# Patient Record
Sex: Female | Born: 1935
Health system: Southern US, Community
[De-identification: ages and names within clinical notes are randomized; demographics above are authoritative.]

## PROBLEM LIST (undated history)

## (undated) DIAGNOSIS — E079 Disorder of thyroid, unspecified: Secondary | ICD-10-CM

## (undated) DIAGNOSIS — I10 Essential (primary) hypertension: Secondary | ICD-10-CM

---

## 2007-05-04 ENCOUNTER — Encounter: Payer: Self-pay | Admitting: Family Medicine

## 2007-08-18 LAB — CONVERTED CEMR LAB: Pap Smear: NORMAL

## 2007-09-22 ENCOUNTER — Encounter: Admission: RE | Admit: 2007-09-22 | Discharge: 2007-09-22 | Payer: Self-pay | Admitting: Interventional Radiology

## 2007-11-15 ENCOUNTER — Encounter: Payer: Self-pay | Admitting: Family Medicine

## 2008-08-08 ENCOUNTER — Encounter: Payer: Self-pay | Admitting: Family Medicine

## 2008-08-08 LAB — CONVERTED CEMR LAB
ALT: 20 units/L
AST: 28 units/L
Albumin: 4.2 g/dL
Alkaline Phosphatase: 66 units/L
BUN: 7 mg/dL
CO2: 32 meq/L
Calcium: 9.7 mg/dL
Chloride: 96 meq/L
Cholesterol: 223 mg/dL
Creatinine, Ser: 0.6 mg/dL
Glucose, Bld: 100 mg/dL
HDL: 70 mg/dL
LDL Cholesterol: 129 mg/dL
Potassium: 4.5 meq/L
Sodium: 137 meq/L
TSH: 0.27 microintl units/mL
Total Bilirubin: 0.8 mg/dL
Total Protein: 6.8 g/dL
Triglycerides: 119 mg/dL

## 2008-10-31 ENCOUNTER — Encounter: Admission: RE | Admit: 2008-10-31 | Discharge: 2008-10-31 | Payer: Self-pay | Admitting: Family Medicine

## 2008-10-31 ENCOUNTER — Ambulatory Visit: Payer: Self-pay | Admitting: Family Medicine

## 2008-10-31 DIAGNOSIS — E039 Hypothyroidism, unspecified: Secondary | ICD-10-CM | POA: Insufficient documentation

## 2008-10-31 DIAGNOSIS — I10 Essential (primary) hypertension: Secondary | ICD-10-CM | POA: Insufficient documentation

## 2008-10-31 DIAGNOSIS — M542 Cervicalgia: Secondary | ICD-10-CM | POA: Insufficient documentation

## 2008-11-01 LAB — CONVERTED CEMR LAB: TSH: 2.742 microintl units/mL (ref 0.350–4.50)

## 2008-11-05 ENCOUNTER — Encounter: Payer: Self-pay | Admitting: Family Medicine

## 2009-01-09 ENCOUNTER — Ambulatory Visit: Payer: Self-pay | Admitting: Family Medicine

## 2009-01-09 DIAGNOSIS — M5090 Cervical disc disorder, unspecified, unspecified cervical region: Secondary | ICD-10-CM | POA: Insufficient documentation

## 2009-01-09 DIAGNOSIS — Z780 Asymptomatic menopausal state: Secondary | ICD-10-CM | POA: Insufficient documentation

## 2009-05-01 ENCOUNTER — Encounter: Payer: Self-pay | Admitting: Family Medicine

## 2009-06-26 ENCOUNTER — Encounter: Payer: Self-pay | Admitting: Family Medicine

## 2009-07-30 ENCOUNTER — Encounter: Payer: Self-pay | Admitting: Family Medicine

## 2009-08-22 ENCOUNTER — Encounter: Payer: Self-pay | Admitting: Family Medicine

## 2009-09-02 ENCOUNTER — Ambulatory Visit: Payer: Self-pay | Admitting: Family Medicine

## 2009-09-02 DIAGNOSIS — R1012 Left upper quadrant pain: Secondary | ICD-10-CM | POA: Insufficient documentation

## 2009-09-03 LAB — CONVERTED CEMR LAB
ALT: 16 units/L (ref 0–35)
AST: 21 units/L (ref 0–37)
Albumin: 4.5 g/dL (ref 3.5–5.2)
Alkaline Phosphatase: 61 units/L (ref 39–117)
BUN: 11 mg/dL (ref 6–23)
Basophils Absolute: 0 10*3/uL (ref 0.0–0.1)
Basophils Relative: 1 % (ref 0–1)
CO2: 29 meq/L (ref 19–32)
Calcium: 9.6 mg/dL (ref 8.4–10.5)
Chloride: 96 meq/L (ref 96–112)
Creatinine, Ser: 0.65 mg/dL (ref 0.40–1.20)
Eosinophils Absolute: 0.1 10*3/uL (ref 0.0–0.7)
Eosinophils Relative: 1 % (ref 0–5)
Glucose, Bld: 80 mg/dL (ref 70–99)
HCT: 40.8 % (ref 36.0–46.0)
Hemoglobin: 13.3 g/dL (ref 12.0–15.0)
Iron: 68 ug/dL (ref 42–145)
Lymphocytes Relative: 38 % (ref 12–46)
Lymphs Abs: 2.3 10*3/uL (ref 0.7–4.0)
MCHC: 32.6 g/dL (ref 30.0–36.0)
MCV: 84.8 fL (ref 78.0–100.0)
Monocytes Absolute: 0.4 10*3/uL (ref 0.1–1.0)
Monocytes Relative: 7 % (ref 3–12)
Neutro Abs: 3.2 10*3/uL (ref 1.7–7.7)
Neutrophils Relative %: 53 % (ref 43–77)
Platelets: 325 10*3/uL (ref 150–400)
Potassium: 3.9 meq/L (ref 3.5–5.3)
RBC: 4.81 M/uL (ref 3.87–5.11)
RDW: 14.7 % (ref 11.5–15.5)
Saturation Ratios: 18 % — ABNORMAL LOW (ref 20–55)
Sodium: 135 meq/L (ref 135–145)
TIBC: 385 ug/dL (ref 250–470)
TSH: 0.542 microintl units/mL (ref 0.350–4.500)
Total Bilirubin: 0.3 mg/dL (ref 0.3–1.2)
Total Protein: 7.2 g/dL (ref 6.0–8.3)
UIBC: 317 ug/dL
Vit D, 25-Hydroxy: 25 ng/mL — ABNORMAL LOW (ref 30–89)
Vitamin B-12: 1309 pg/mL — ABNORMAL HIGH (ref 211–911)
WBC: 6 10*3/uL (ref 4.0–10.5)

## 2009-09-10 DIAGNOSIS — E785 Hyperlipidemia, unspecified: Secondary | ICD-10-CM | POA: Insufficient documentation

## 2009-09-10 DIAGNOSIS — I209 Angina pectoris, unspecified: Secondary | ICD-10-CM | POA: Insufficient documentation

## 2009-09-17 ENCOUNTER — Encounter: Admission: RE | Admit: 2009-09-17 | Discharge: 2009-09-17 | Payer: Self-pay | Admitting: Family Medicine

## 2009-09-17 ENCOUNTER — Encounter: Payer: Self-pay | Admitting: Family Medicine

## 2009-09-18 DIAGNOSIS — M899 Disorder of bone, unspecified: Secondary | ICD-10-CM | POA: Insufficient documentation

## 2009-09-18 DIAGNOSIS — M949 Disorder of cartilage, unspecified: Secondary | ICD-10-CM

## 2009-10-29 IMAGING — CR DG CERVICAL SPINE 2 OR 3 VIEWS
4 series · 4 of 4 positions shown · non-contrast
Comparison: None

CLINICAL DATA: Neck pain.

CERVICAL SPINE - 2-3 VIEW

[view not recorded (1 of 4)]
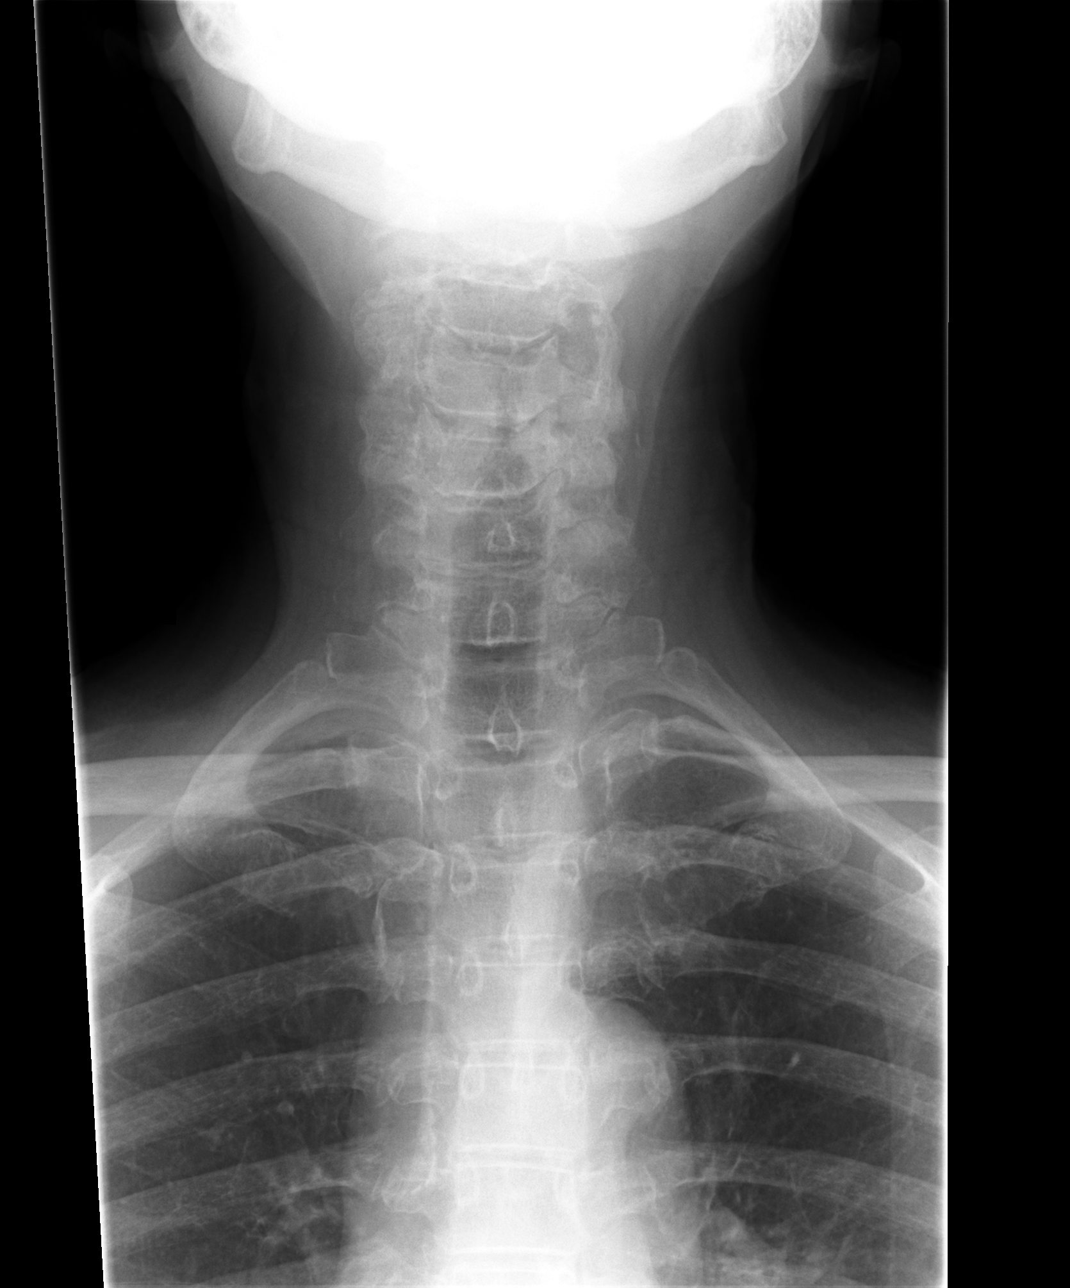

[view not recorded (2 of 4)]
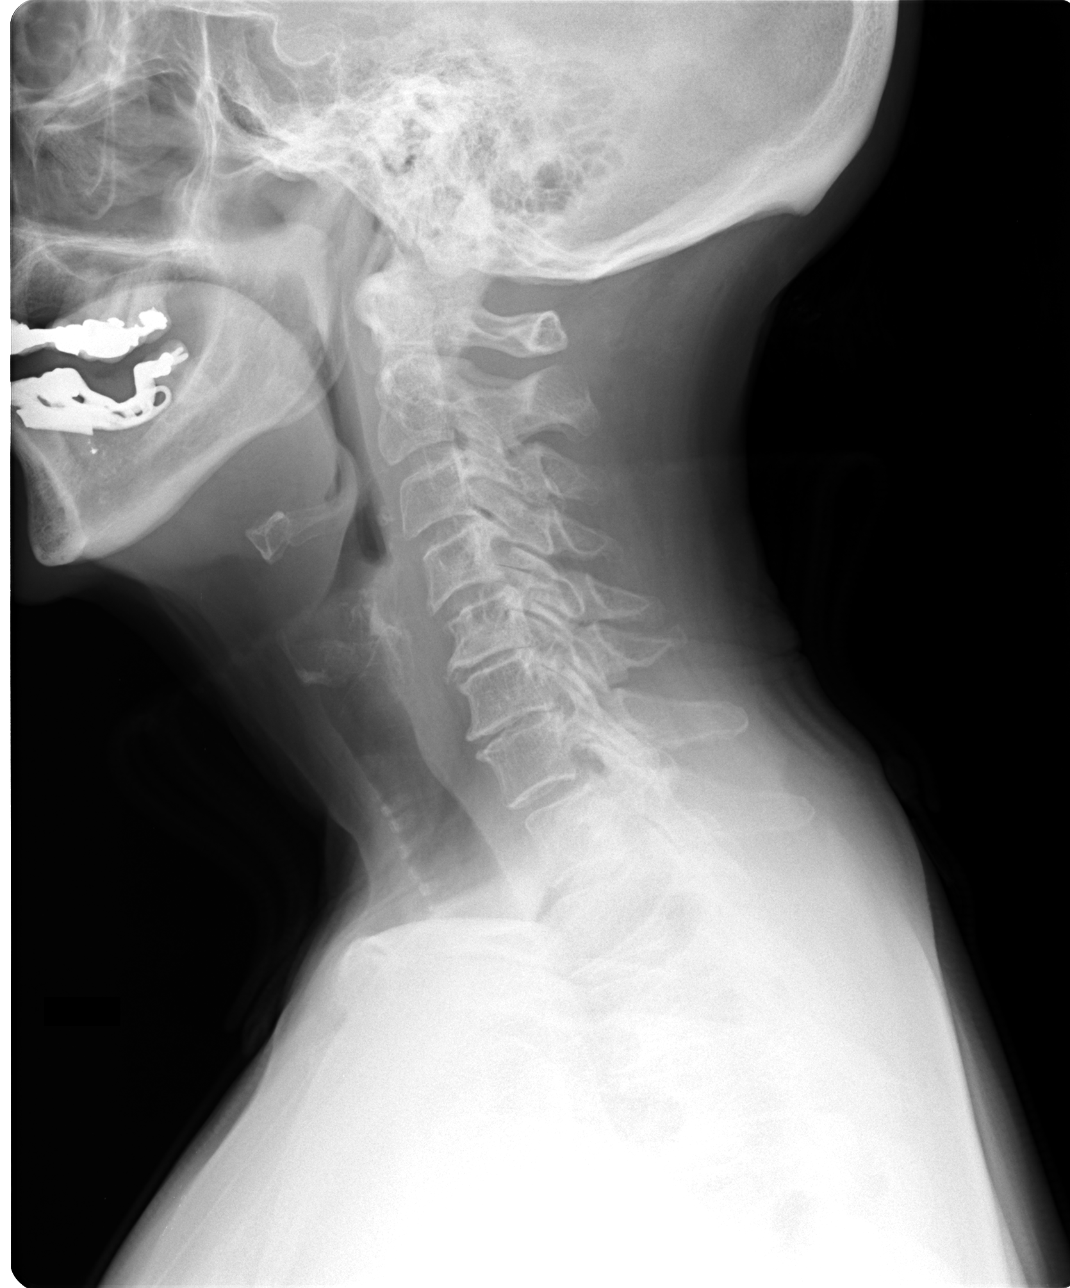

[view not recorded (3 of 4)]
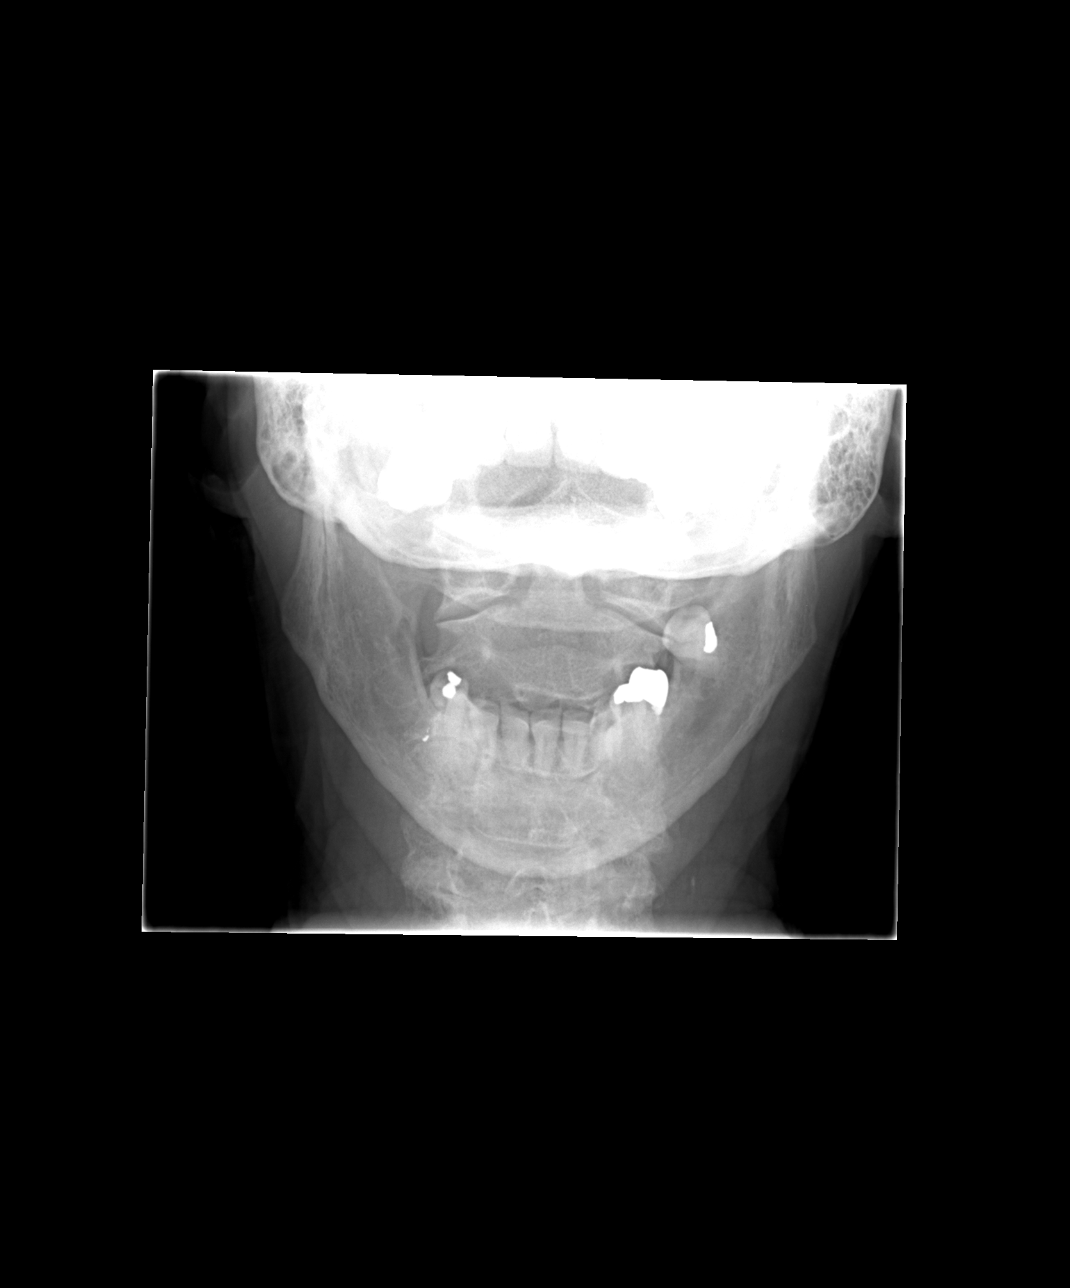

[view not recorded (4 of 4)]
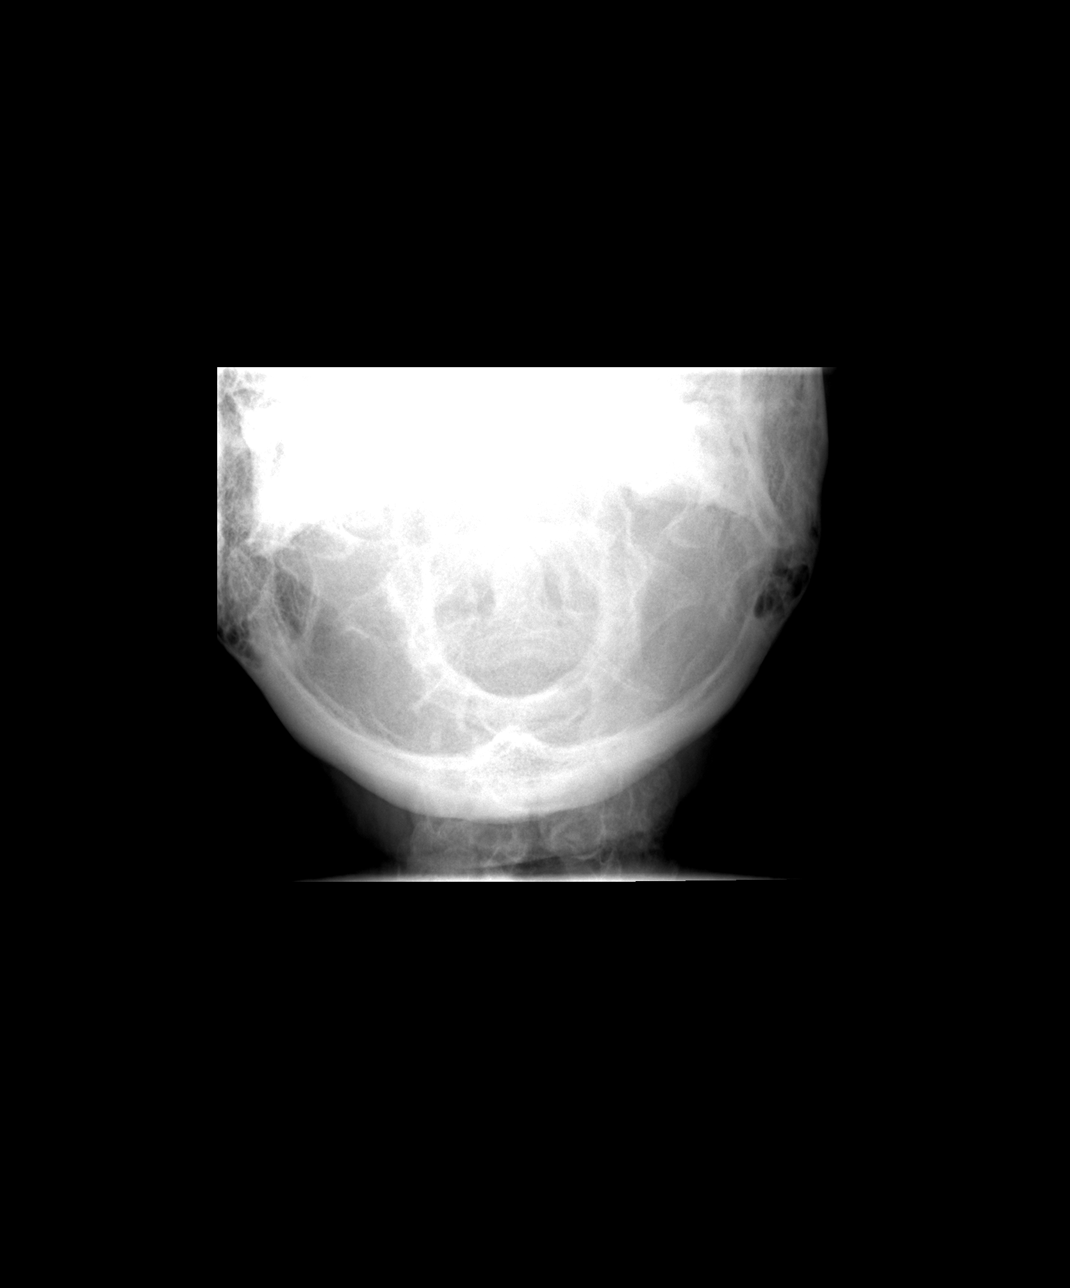

[4 of 4 positions shown; findings below may reference images not displayed]

FINDINGS: Cervical spine is visualized from the occiput to T1.
Prevertebral soft tissues are within normal limits.  There is mild
straightening of the normal cervical lordosis.  Multilevel
uncovertebral hypertrophy and endplate degenerative changes are
seen.  Loss of disc space height is worst at C5-6 and C6-7.  Dens
is partially obscured on the dedicated view.  Visualized lung
apices are clear.
IMPRESSION: Straightening of the normal cervical lordosis with spondylosis,
worst at C5-6 and C6-7.

## 2010-02-24 ENCOUNTER — Encounter: Payer: Self-pay | Admitting: Family Medicine

## 2010-09-15 IMAGING — OT DG DXA BONE DENSITY STUDY HL7
2 series · 2 of 2 positions shown · non-contrast
Comparison: None.

CLINICAL DATA: 79-year-old post menopausal Caucasian female with
history of low vitamin D levels.  Currently on calcium and vitamin
D supplementation.

[Series 1: — · left · 1 of 1 slices shown (1 of 2)]
[im 1/1]
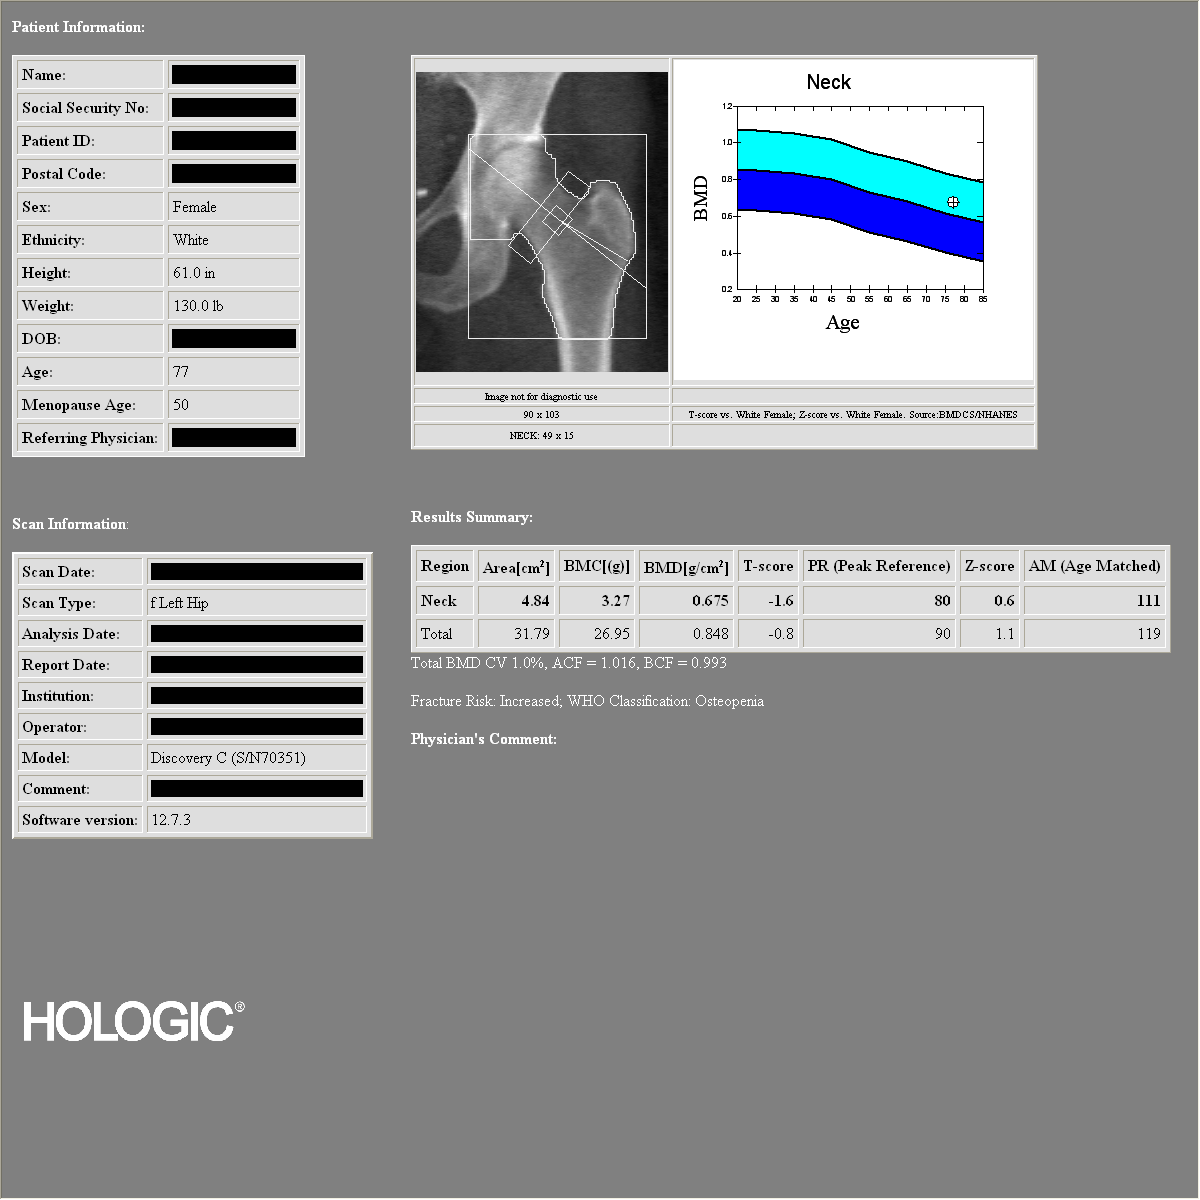

[Series 2: — · 1 of 1 slices shown (2 of 2)]
[im 1/1]
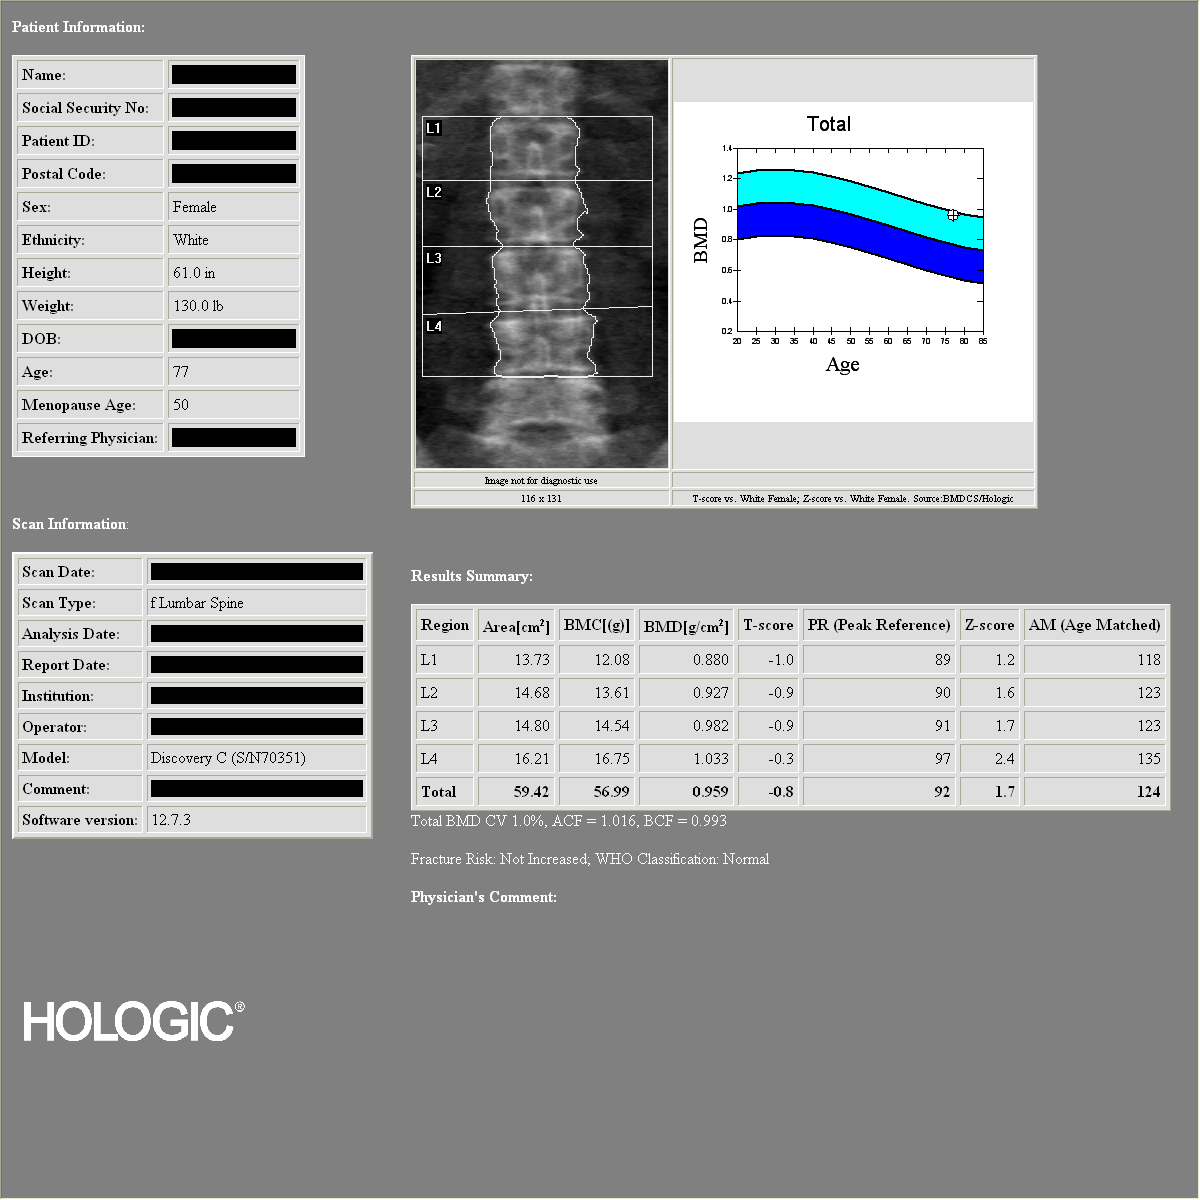

[2 of 2 positions shown; findings below may reference images not displayed]

DUAL X-RAY ABSORPTIOMETRY (DXA) FOR BONE MINERAL DENSITY

AP LUMBAR SPINE TOTAL

Bone Mineral Density (BMD):            0.959 g/cm2
Young Adult T Score:                          -0.8
Z Score:

LEFT FEMUR NECK

Bone Mineral Density (BMD):             0.675 g/cm2
Young Adult T Score:                           -1.6
Z Score:

ASSESSMENT:  Patient's diagnostic category is LOW BONE MASS
(OSTEOPENIA) by WHO Criteria.

FRACTURE RISK: INCREASED

FRAX: Based on the World Health Organization FRAX model, the 10
year probability of a major osteoporotic fracture is 12%.  The 10
year probability of a hip fracture is 2.7%.
RECOMMENDATIONS:

Effective therapies are available in the form of bisphosphonates,
selective estrogen receptor modulators, biologic agents, and
hormone replacement therapy (for women).  All patients should
ensure an adequate intake of dietary calcium (1200mg daily) and
vitamin D (800 Auroe Ersoy) unless contraindicated.

All treatment decisions require clinical judgement and
consideration of individual patient factors, including patient
preferences, co-morbidities, previous drug use, risk factors not
captured in the FRAX model (e.g., frailty, falls, vitamin D
deficiency, increased bone turnover, interval significant decline
in bone density) and possible under-or over-estimation of fracture
risk by FRAX.

The National Osteoporosis Foundation recommends that FDA-approved
medical therapies be considered in postmenopausal women and mean
age 50 or older with a:

      1)     Hip or vertebral (clinical or morphometric) fracture.

2)    T-score of -2.5 or lower at the spine or hip.
3)    Ten-year fracture probability by FRAX of 3% or greater for
hip fracture or 20% or greater for major osteoporotic fracture.
FOLLOW-UP:

People with diagnosed cases of osteoporosis or at high risk for
fracture should have regular bone mineral density tests.  For
patients eligible for Medicare, routine testing is allowed once
every 2 years.  The testing frequency can be increased to one year
for patients who have rapidly progressing disease, those who are
receiving or discontinuing medical therapy to restore bone mass, or
have additional risk factors.

World Health Organization (WHO) Criteria:

Normal: T scores from +1.0 to -1.0
Low Bone Mass (Osteopenia): T scores between -1.0 and -2.5
Osteoporosis: T scores -2.5 and below

Comparison to Reference Population:

T score is the key measure used in the diagnosis of osteoporosis
and relative risk determination for fracture.  It provides a value
for bone mass relative to the mean bone mass of a young adult
reference population expressed in terms of standard deviation (SD).

Z score is the age-matched score showing the patient's values
compared to a population matched for age, sex, and race.  This is
also expressed in terms of standard deviation.  The patient may
have values that compare favorably to the age-matched values and
still be at increased risk for fracture.

## 2010-11-11 NOTE — Letter (Signed)
Summary: [facility]  [facility]   Imported By: Lanelle Bal 03/06/2010 13:22:40  _____________________________________________________________________  External Attachment:    Type:   Image     Comment:   External Document
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
## 2010-11-11 NOTE — Letter (Signed)
Summary: [facility]  [facility]   Imported By: Lanelle Bal 09/25/2009 13:37:09  _____________________________________________________________________  External Attachment:    Type:   Image     Comment:   External Document

## 2010-11-11 NOTE — Letter (Signed)
Summary: [facility]  [facility]   Imported By: Lanelle Bal 09/25/2009 13:40:39  _____________________________________________________________________  External Attachment:    Type:   Image     Comment:   External Document

## 2010-11-11 NOTE — Letter (Signed)
Summary: [facility]  [facility]   Imported By: Lanelle Bal 09/25/2009 13:41:43  _____________________________________________________________________  External Attachment:    Type:   Image     Comment:   External Document

## 2010-11-11 NOTE — Letter (Signed)
Summary: [facility]  [facility]   Imported By: Lanelle Bal 05/15/2009 11:51:09  _____________________________________________________________________  External Attachment:    Type:   Image     Comment:   External Document

## 2010-11-11 NOTE — Assessment & Plan Note (Signed)
Summary: neck pain/ LUQ pain/ fatigue   Vital Signs:  Patient profile:   77 year old female Height:      61.5 inches Weight:      133 pounds BMI:     24.81 O2 Sat:      99 % on Room air Temp:     97.8 degrees F oral Pulse rate:   71 / minute BP supine:   126 / 59  (left arm) BP sitting:   125 / 72  (left arm) BP standing:   114 / 63  (left arm) Cuff size:   regular  Vitals Entered By: Payton Spark CMA (September 02, 2009 10:33 AM)  O2 Flow:  Room air CC: Light headed x 2 weeks.    Primary Care Provider:  Seymour Bars DO  CC:  Light headed x 2 weeks. Marland Kitchen  History of Present Illness: 77 yo WF presents for neck pain worse on the L side.  She started going to a chiropractor 9 mos ago but it has not been helping any more.  It has improved a little bit.  Denies radiation past the shoulder.  Has some L wrist and hand paresthesias.  No weakness.    She started feeling dizzy last wk, more lightheaded than anything else.  No syncope.  No CP or SOB.  She had an echo done in Sept of this year.  She apparently has folllowed with [facility] but I do not have any recent notes.  She reports having carotid dopplers and abd aortic u/s done in the last month and everything was 'normal'.   Has occasional LUQ pain.  She does get indigestion.  She just takes [place].  Occasional constipation.  No melena or hematochezia.  Had a colonoscopy < 10 yrs ago.  Her L ear hurts a little bit.  She gets L temporal pain.  MRI of the head a few years ago for the same thing and it was normal.  Had eye exam last wk.  No diarrhea.  No N/V.  Has intentioanlly lost 7 lbs.   Current Medications (verified): 1)  Levoxyl 100 Mcg Tabs (Levothyroxine Sodium) .... Take 1 Tablet By Mouth Once A Day 2)  Lisinopril-Hydrochlorothiazide 20-12.5 Mg Tabs (Lisinopril-Hydrochlorothiazide) .... Take 1 Tablet By Mouth Once A Day 3)  Aspirin Adult Low Strength 81 Mg Tbec (Aspirin) .Marland Kitchen.. 1 Tab By Mouth Daily  Allergies (verified): 1)   Vicodin  Past History:  Past Medical History: hypothyroidism HTN dyslipidemia arthritis postmenopausal G2P2 cervical spondylosis  ABIs normal8 05-12-08 [facility]  Past Surgical History: Reviewed history from 10/31/2008 and no changes required. TAH w/o oophorectomy 1970s for fibroids rectocele and cystocele surgery 11-04 R RTC surgery 4-08 appendectomy  Social History: Reviewed history from 10/31/2008 and no changes required. Housewife.  Retired Diplomatic Services operational officer., Married to [place] and has 2 grown children, local and 3 grandkids. Never smoked. Does farming, gardening and walking. Good diet.  Not sexually active.  Review of Systems General:  Complains of fatigue; denies chills, fever, loss of appetite, sleep disorder, and weakness. CV:  Denies chest pain or discomfort, palpitations, shortness of breath with exertion, and swelling of feet. Resp:  Denies cough and shortness of breath. GI:  Complains of abdominal pain and indigestion; denies bloody stools, change in bowel habits, constipation, dark tarry stools, diarrhea, gas, nausea, and vomiting. MS:  neck pain.  Physical Exam  General:  alert, well-developed, well-nourished, and well-hydrated.   Head:  normocephalic and atraumatic.   Eyes:  sclera  non icteric; PERRLA Nose:  no nasal discharge.   Mouth:  good dentition and pharynx pink and moist.   Neck:  no masses.  globally reduced ROM no audible carotid bruits Lungs:  Normal respiratory effort, chest expands symmetrically. Lungs are clear to auscultation, no crackles or wheezes. Heart:  Normal rate and regular rhythm. S1 and S2 normal without gallop, murmur, click, rub or other extra sounds. Abdomen:  soft, non-tender, normal bowel sounds, no distention, no masses, no guarding, no rigidity, no hepatomegaly, and no splenomegaly.  no AA bruits Msk:  grip + 5/5 full active bilat shoulder ROM Extremities:  no LE edema Skin:  color normal.   Psych:  good eye contact, not  anxious appearing, and not depressed appearing.     Impression & Recommendations:  Problem # 1:  CERVICAL DISC DISORDER (ICD-722.91) Chronic, getting worse even w/ chiropractic treatments. Exam c/w arthritis.  Start on RX Lodine with food.  Can alternate with Tyelnol. Caution with dyspepsia.  Problem # 2:  FATIGUE (ICD-780.79) Orthostatics negative.  No syncope.  BP stable.  Obtain labs to r/o iron or vitamin deficiencies.  F/U results.  Talk to cardiology about recent testing she had done. Orders: T-CBC w/Diff [id number]) T-Iron [phone]) T-Iron Binding Capacity (TIBC) ([id number]) T-Vitamin B12 [phone]) T-Vitamin D (25-Hydroxy) ([id number])  Problem # 3:  ESSENTIAL HYPERTENSION, BENIGN (ICD-401.1) Well controlled.  RFd meds.  Update labs. Her updated medication list for this problem includes:    Lisinopril-hydrochlorothiazide 20-12.5 Mg Tabs (Lisinopril-hydrochlorothiazide) .Marland Kitchen... Take 1 tablet by mouth once a day  Orders: T-Comprehensive Metabolic Panel ([id number])  BP today: 125/72 Prior BP: 120/59 (01/09/2009)  Labs Reviewed: K+: 4.5 (08/08/2008) Creat: : 0.6 (08/08/2008)   Chol: 223 (08/08/2008)   HDL: 70 (08/08/2008)   LDL: 129 (08/08/2008)   TG: 119 (08/08/2008)  Problem # 4:  UNSPECIFIED HYPOTHYROIDISM (ICD-244.9) Due for updating TSH. Her updated medication list for this problem includes:    Levoxyl 100 Mcg Tabs (Levothyroxine sodium) .Marland Kitchen... Take 1 tablet by mouth once a day  Orders: T-TSH ([id number])  Labs Reviewed: TSH: 2.742 (10/31/2008)    Chol: 223 (08/08/2008)   HDL: 70 (08/08/2008)   LDL: 129 (08/08/2008)   TG: 119 (08/08/2008)  Problem # 5:  LUQ PAIN (ICD-789.02) Benign exam findings. Start empirically on PPI (Dexilant once daily samples given) due to heavy use of TUMS. Refer back to GI if failing to improve.  Caution with NSAIDs for neck arthritis.  Complete Medication List: 1)  Levoxyl 100 Mcg Tabs (Levothyroxine sodium) ....  Take 1 tablet by mouth once a day 2)  Lisinopril-hydrochlorothiazide 20-12.5 Mg Tabs (Lisinopril-hydrochlorothiazide) .... Take 1 tablet by mouth once a day 3)  Aspirin Adult Low Strength 81 Mg Tbec (Aspirin) .Marland Kitchen.. 1 tab by mouth daily 4)  Etodolac 300 Mg Caps (Etodolac) .Marland Kitchen.. 1 capsule by mouth two times a day as needed joint/ neck pain.  take with food 5)  Alprazolam 0.25 Mg Tbdp (Alprazolam) .Marland Kitchen.. 1 tab by mouth two times a day as needed anxiety  Patient Instructions: 1)  Will get your results from [facility]. 2)  Labs today. 3)  Will call you w/ results tomorrow. 4)  Start Etodolac up to 2 x a day with food as needed for joint/ neck pain.  Alternate with Tylenol. 5)  Use Alprazolam as needed for anxiety.  Use sparingly. 6)  Try samples of Dexilant 1 capsule by mouth daily for LUQ pain, likely due to acid reflux. 7)  Return for f/u in  2 months. Prescriptions: ALPRAZOLAM 0.25 MG TBDP (ALPRAZOLAM) 1 tab by mouth two times a day as needed anxiety  #30 x 0   Entered and Authorized by:   Seymour Bars DO   Signed by:   Seymour Bars DO on 09/02/2009   Method used:   Printed then faxed to ...       [facility] [phone]* (retail)       [address] [place], [place]  [address]       Ph: [phone]       Fax: [phone]   RxID:   [id number] ETODOLAC 300 MG CAPS (ETODOLAC) 1 capsule by mouth two times a day as needed joint/ neck pain.  take with food  #60 x 0   Entered and Authorized by:   Seymour Bars DO   Signed by:   Seymour Bars DO on 09/02/2009   Method used:   Electronically to        [facility] [phone]* (retail)       [address] [place], [place]  [address]       Ph: [phone]       Fax: [phone]   RxID:   [id number]   Appended Document: neck pain/ LUQ pain/ fatigue     Past History:  Past Medical History: hypothyroidism chronic stable angia, stress echo + mild anterior ischemia 07-2009; medical therapy.  pt refused  cath. HTN dyslipidemia arthritis postmenopausal G2P2 cervical spondylosis  ABIs normal8 05-12-08 [facility]- Dr Mindi Curling carotid dopplers, PAD screening/ AAA screening normal 07-2009

## 2010-11-11 NOTE — Letter (Signed)
Summary: [facility]  [facility]   Imported By: Lanelle Bal 11/13/2008 09:09:11  _____________________________________________________________________  External Attachment:    Type:   Image     Comment:   External Document

## 2010-11-11 NOTE — Miscellaneous (Signed)
Summary: old records  Clinical Lists Changes  Observations: Added new observation of PAST MED HX: hypothyroidism HTN dyslipidemia arthritis postmenopausal G2P2  ABIs normal8 05-12-08 (11/05/2008 13:30) Added new observation of PRIMARY MD: Seymour Bars DO (11/05/2008 13:30) Added new observation of LDL: 129 mg/dL (54/06/8118 14:78) Added new observation of HDL: 70 mg/dL (29/56/2130 86:57) Added new observation of TRIGLYC TOT: 119 mg/dL (84/69/6295 28:41) Added new observation of CHOLESTEROL: 223 mg/dL (32/44/0102 72:53) Added new observation of TSH: 0.27 microintl units/mL (08/08/2008 13:30) Added new observation of CALCIUM: 9.7 mg/dL (66/44/0347 42:59) Added new observation of ALBUMIN: 4.2 g/dL (56/38/7564 33:29) Added new observation of PROTEIN, TOT: 6.8 g/dL (51/88/4166 06:30) Added new observation of SGPT (ALT): 20 units/L (08/08/2008 13:30) Added new observation of SGOT (AST): 28 units/L (08/08/2008 13:30) Added new observation of ALK PHOS: 66 units/L (08/08/2008 13:30) Added new observation of BILI TOTAL: 0.8 mg/dL (16/10/930 35:57) Added new observation of CREATININE: 0.6 mg/dL (32/20/2542 70:62) Added new observation of BUN: 7 mg/dL (37/62/8315 17:61) Added new observation of BG RANDOM: 100 mg/dL (60/73/7106 26:94) Added new observation of CO2 PLSM/SER: 32 meq/L (08/08/2008 13:30) Added new observation of CL SERUM: 96 meq/L (08/08/2008 13:30) Added new observation of K SERUM: 4.5 meq/L (08/08/2008 13:30) Added new observation of NA: 137 meq/L (08/08/2008 13:30) Added new observation of BONE DENSITY: peripheral DEXA  T score 1.2 normal.   std dev (01/30/2008 13:34)      Bone Density  Procedure date:  01/30/2008  Findings:      peripheral DEXA  T score 1.2 normal.     Bone Density  Procedure date:  01/30/2008  Findings:      peripheral DEXA  T score 1.2 normal.       Past Medical History:    hypothyroidism    HTN    dyslipidemia    arthritis  postmenopausal    G2P2        ABIs normal8 05-12-08

## 2010-11-11 NOTE — Assessment & Plan Note (Signed)
Summary: cervical DDD/ BP/ thyroid   Vital Signs:  Patient profile:   77 year old female Height:      61.5 inches Weight:      140 pounds O2 Sat:      99 % Pulse rate:   61 / minute BP sitting:   120 / 59  (left arm) Cuff size:   regular  Vitals Entered By: Harlene Salts (January 09, 2009 9:02 AM)  History of Present Illness: follow-up BP,sleep problem,neck pain,right eye spot,left ear pain  77 yo WF presents for f/u.  1.  Cervical DDD.  Improved.  She started seeing the chiropractor which has helped.  She is off NSAIDs.  She had arthritis on Xray last month.    2.  HTN- on Lisinopril/HCTZ  and never took Atenolol.  Felt like her neck pain was causing her sleepless nights and higher BPs.  Denies CP or DOE.  Had labs done at a health fair last wk showing Tchol 206, HDL 59, LDL 125, TG 110 and fasting gluc 87.  3.  Thyoid perfect on Levoxyl 100 micrograms/ day.  4.  R lower eyelid problem.  Due to see Dr Clearance Coots.  No scratching or blurry vision.  Feels like it 'won't close all the way' at time.  Asx at present.     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       Never smoked.       Does farming, gardening and walking.       Good diet.  Not sexually active.  Review of Systems      See HPI  Physical Exam  General:  alert, well-developed, well-nourished, and well-hydrated.   Head:  normocephalic and atraumatic.   Eyes:  pupils equal, pupils round, and pupils reactive to light.  wears glasses.  No redness or ectropion.  Normal tear ducts.  Slight lower lid twitching on the R Nose:  no nasal discharge.   Mouth:  pharynx pink and  moist.   Neck:  no masses.  improved ROM esp with rotation.  limited SB bilat Lungs:  Normal respiratory effort, chest expands symmetrically. Lungs are clear to auscultation, no crackles or wheezes. Heart:  Normal rate and regular rhythm. S1 and S2 normal without gallop, murmur, click, rub or other extra sounds. Abdomen:  soft, non-tender, normal bowel sounds, and no distention.   Extremities:  trace nonpitting ankle edema bilat Skin:  color normal.   Psych:  good eye contact, not anxious appearing, and not depressed appearing.     Impression & Recommendations:  Problem # 1:  CERVICAL DISC DISORDER (ICD-722.91) Assessment Improved Xrayed last month confirming spondylosis.  Improved with chiropractic manipulation and home stretches.  OK to continue this along with heat and Tylenol arthritis as needed.  Problem # 2:  ESSENTIAL HYPERTENSION, BENIGN (ICD-401.1) BP looks great.  She did not end up needing to add Atenolol.  Labs are UTD. The following medications were removed from the medication list:    Atenolol  25 Mg Tabs (Atenolol) .Marland Kitchen... 1 tab by mouth daily Her updated medication list for this problem includes:    Lisinopril-hydrochlorothiazide 20-12.5 Mg Tabs (Lisinopril-hydrochlorothiazide) .Marland Kitchen... Take 1 tablet by mouth once a day  BP today: 120/59 Prior BP: 146/65 (10/31/2008)  Labs Reviewed: K+: 4.5 (08/08/2008) Creat: : 0.6 (08/08/2008)   Chol: 223 (08/08/2008)   HDL: 70 (08/08/2008)   LDL: 129 (08/08/2008)   TG: 119 (08/08/2008)  Problem # 3:  UNSPECIFIED HYPOTHYROIDISM (ICD-244.9) TSH perfect.  Cont current dose of Levoxyl. Her updated medication list for this problem includes:    Levoxyl 100 Mcg Tabs (Levothyroxine sodium) .Marland Kitchen... Take 1 tablet by mouth once a day  For her eye symptoms, normal exam findings.  She is going to be following up with Dr Clearance Coots soon to discuss.  Complete Medication List: 1)  Levoxyl 100 Mcg Tabs (Levothyroxine sodium) .... Take 1 tablet by mouth  once a day 2)  Lisinopril-hydrochlorothiazide 20-12.5 Mg Tabs (Lisinopril-hydrochlorothiazide) .... Take 1 tablet by mouth once a day 3)  Aspirin Adult Low Strength 81 Mg Tbec (Aspirin) .Marland Kitchen.. 1 tab by mouth daily  Other Orders: T-Mammography Bilateral Screening (56387) T-Dual DXA Bone Density/ Axial (56433) T-DXA Bone Density/ Appendicular (29518)  Patient Instructions: 1)  For neck arthritis, continue chiropractic treatments, home stretches and use of Tylenol arthritis as needed. 2)  Have mammogram and bone density done in the next 2 months.   3)  BP looks great! 4)  F/U with Dr Clearance Coots for eye symptoms.  I do not see anything on exam that explains your symptoms. 5)  Return for f/u BP/ neck pain in 6 months.

## 2011-05-01 ENCOUNTER — Other Ambulatory Visit: Payer: Self-pay | Admitting: *Deleted

## 2011-05-01 MED ORDER — LEVOTHYROXINE SODIUM 100 MCG PO TABS: 100.0000 ug | ORAL_TABLET | Freq: Every day | ORAL | Status: AC

## 2011-05-01 MED ORDER — LISINOPRIL-HYDROCHLOROTHIAZIDE 20-12.5 MG PO TABS: 1.0000 | ORAL_TABLET | Freq: Every day | ORAL | Status: AC

## 2021-03-01 ENCOUNTER — Emergency Department: Admit: 2021-03-01 | Discharge status: Against Medical Advice | Payer: Self-pay

## 2021-03-01 ENCOUNTER — Other Ambulatory Visit: Payer: Self-pay

## 2021-03-01 ENCOUNTER — Encounter: Payer: Self-pay | Admitting: Emergency Medicine

## 2021-03-01 ENCOUNTER — Emergency Department
Admission: EM | Admit: 2021-03-01 | Discharge: 2021-03-01 | Disposition: A | Discharge status: Home / Self Care | Payer: Medicare Other | Source: Home / Self Care

## 2021-03-01 DIAGNOSIS — R300 Dysuria: Secondary | ICD-10-CM | POA: Diagnosis not present

## 2021-03-01 HISTORY — DX: Disorder of thyroid, unspecified: E07.9

## 2021-03-01 HISTORY — DX: Essential (primary) hypertension: I10

## 2021-03-01 LAB — POCT URINALYSIS DIP (MANUAL ENTRY)
Bilirubin, UA: NEGATIVE
Glucose, UA: NEGATIVE mg/dL
Ketones, POC UA: NEGATIVE mg/dL
Nitrite, UA: POSITIVE — AB
Protein Ur, POC: NEGATIVE mg/dL
Spec Grav, UA: 1.01 (ref 1.010–1.025)
Urobilinogen, UA: 0.2 E.U./dL
pH, UA: 7 (ref 5.0–8.0)

## 2021-03-01 MED ORDER — CEPHALEXIN 500 MG PO CAPS: 500.0000 mg | ORAL_CAPSULE | Freq: Four times a day (QID) | ORAL | 0 refills | Status: AC

## 2021-03-01 NOTE — ED Triage Notes (Signed)
Patient is c/o possible UTI, dysuria, no hematuria, frequency x 1 day.

## 2021-03-03 LAB — URINE CULTURE
MICRO NUMBER:: 11920729
SPECIMEN QUALITY:: ADEQUATE

## 2021-03-03 NOTE — ED Provider Notes (Signed)
[facility]    CSN: [id number] Arrival date & time: 03/01/21  0931      History   Chief Complaint Chief Complaint  Patient presents with  . UTI    HPI Linda Beasley is a 87 y.o. female.   The history is provided by the patient. No language interpreter was used.  Dysuria Pain quality:  Aching Pain severity:  No pain Onset quality:  Gradual Timing:  Constant Progression:  Worsening Chronicity:  New Recent urinary tract infections: no   Relieved by:  Nothing Worsened by:  Nothing Associated symptoms: no abdominal pain   Risk factors: recurrent urinary tract infections     Past Medical History:  Diagnosis Date  . Hypertension   . Thyroid disease     Patient Active Problem List   Diagnosis Date Noted  . OSTEOPENIA 09/18/2009  . HYPERLIPIDEMIA 09/10/2009  . ANGINA PECTORIS, CHRONIC 09/10/2009  . LUQ PAIN 09/02/2009  . CERVICAL DISC DISORDER 01/09/2009  . POSTMENOPAUSAL STATUS 01/09/2009  . UNSPECIFIED HYPOTHYROIDISM 10/31/2008  . ESSENTIAL HYPERTENSION, BENIGN 10/31/2008  . NECK PAIN 10/31/2008    History reviewed. No pertinent surgical history.  OB History   No obstetric history on file.      Home Medications    Prior to Admission medications   Medication Sig Start Date End Date Taking? Authorizing Provider  cephALEXin (KEFLEX) 500 MG capsule Take 1 capsule (500 mg total) by mouth 4 (four) times daily for 10 days. 03/01/21 03/11/21 Yes Elson Areas, PA-C  levothyroxine (SYNTHROID, LEVOTHROID) 100 MCG tablet Take 1 tablet (100 mcg total) by mouth daily. 05/01/11  Yes Bowen, Scot Jun, DO  lisinopril-hydrochlorothiazide (PRINZIDE,ZESTORETIC) 20-12.5 MG per tablet Take 1 tablet by mouth daily. 05/01/11  Yes Bowen, Scot Jun, DO    Family History No family history on file.  Social History Social History   Tobacco Use  . Smoking status: Never Smoker  . Smokeless tobacco: Never Used     Allergies   Hydrocodone-acetaminophen and  Other   Review of Systems Review of Systems  Gastrointestinal: Negative for abdominal pain.  Genitourinary: Positive for dysuria.  All other systems reviewed and are negative.    Physical Exam Triage Vital Signs ED Triage Vitals  Enc Vitals Group     BP 03/01/21 0954 (!) 177/61     Pulse Rate 03/01/21 0954 63     Resp --      Temp 03/01/21 0954 98.6 F (37 C)     Temp Source 03/01/21 0954 Oral     SpO2 03/01/21 0954 98 %     Weight --      Height --      Head Circumference --      Peak Flow --      Pain Score 03/01/21 0956 0     Pain Loc --      Pain Edu? --      Excl. in GC? --    No data found.  Updated Vital Signs BP (!) 177/61 (BP Location: Left Arm)   Pulse 63   Temp 98.6 F (37 C) (Oral)   SpO2 98%   Visual Acuity Right Eye Distance:   Left Eye Distance:   Bilateral Distance:    Right Eye Near:   Left Eye Near:    Bilateral Near:     Physical Exam Vitals and nursing note reviewed.  Constitutional:      Appearance: She is well-developed.  HENT:     Head: Normocephalic.  Cardiovascular:     Rate and Rhythm: Normal rate.  Pulmonary:     Effort: Pulmonary effort is normal.  Abdominal:     General: Abdomen is flat. There is no distension.     Palpations: Abdomen is soft.  Musculoskeletal:        General: Normal range of motion.     Cervical back: Normal range of motion.  Neurological:     Mental Status: She is alert and oriented to person, place, and time.      UC Treatments / Results  Labs (all labs ordered are listed, but only abnormal results are displayed) Labs Reviewed  URINE CULTURE - Abnormal; Notable for the following components:      Result Value   ISOLATE 1: Escherichia coli (*)    All other components within normal limits  POCT URINALYSIS DIP (MANUAL ENTRY) - Abnormal; Notable for the following components:   Color, UA other (*)    Blood, UA moderate (*)    Nitrite, UA Positive (*)    Leukocytes, UA Moderate (2+) (*)     All other components within normal limits    EKG   Radiology No results found.  Procedures Procedures (including critical care time)  Medications Ordered in UC Medications - No data to display  Initial Impression / Assessment and Plan / UC Course  I have reviewed the triage vital signs and the nursing notes.  Pertinent labs & imaging results that were available during my care of the patient were reviewed by me and considered in my medical decision making (see chart for details).     MDM:  Pt given rx for keflex.  Culture ordered  Final Clinical Impressions(s) / UC Diagnoses   Final diagnoses:  Dysuria   Discharge Instructions   None    ED Prescriptions    Medication Sig Dispense Auth. Provider   cephALEXin (KEFLEX) 500 MG capsule Take 1 capsule (500 mg total) by mouth 4 (four) times daily for 10 days. 40 capsule Elson Areas, [place]     PDMP not reviewed this encounter.  An After Visit Summary was printed and given to the patient.    Elson Areas, [place] 03/03/21 [phone]
